# Patient Record
Sex: Male | Born: 1965 | Race: White | Hispanic: No | Marital: Married | Smoking: Current every day smoker
Health system: Southern US, Community
[De-identification: ages and names within clinical notes are randomized; demographics above are authoritative.]

## PROBLEM LIST (undated history)

## (undated) HISTORY — PX: APPENDECTOMY: SHX54

---

## 2013-10-26 ENCOUNTER — Ambulatory Visit: Payer: Self-pay | Admitting: Emergency Medicine

## 2019-06-19 ENCOUNTER — Emergency Department: Payer: Self-pay

## 2019-06-19 ENCOUNTER — Other Ambulatory Visit: Payer: Self-pay

## 2019-06-19 ENCOUNTER — Emergency Department
Admission: EM | Admit: 2019-06-19 | Discharge: 2019-06-20 | Disposition: A | Discharge status: Home / Self Care | Payer: Self-pay | Attending: Emergency Medicine | Admitting: Emergency Medicine

## 2019-06-19 ENCOUNTER — Encounter: Payer: Self-pay | Admitting: Emergency Medicine

## 2019-06-19 DIAGNOSIS — F17200 Nicotine dependence, unspecified, uncomplicated: Secondary | ICD-10-CM | POA: Insufficient documentation

## 2019-06-19 DIAGNOSIS — W11XXXA Fall on and from ladder, initial encounter: Secondary | ICD-10-CM | POA: Insufficient documentation

## 2019-06-19 DIAGNOSIS — S0990XA Unspecified injury of head, initial encounter: Secondary | ICD-10-CM

## 2019-06-19 DIAGNOSIS — M546 Pain in thoracic spine: Secondary | ICD-10-CM

## 2019-06-19 DIAGNOSIS — S22019A Unspecified fracture of first thoracic vertebra, initial encounter for closed fracture: Secondary | ICD-10-CM

## 2019-06-19 DIAGNOSIS — S098XXA Other specified injuries of head, initial encounter: Secondary | ICD-10-CM | POA: Insufficient documentation

## 2019-06-19 DIAGNOSIS — Y999 Unspecified external cause status: Secondary | ICD-10-CM | POA: Insufficient documentation

## 2019-06-19 DIAGNOSIS — Y9389 Activity, other specified: Secondary | ICD-10-CM | POA: Insufficient documentation

## 2019-06-19 DIAGNOSIS — S22069A Unspecified fracture of T7-T8 vertebra, initial encounter for closed fracture: Secondary | ICD-10-CM

## 2019-06-19 DIAGNOSIS — Y929 Unspecified place or not applicable: Secondary | ICD-10-CM | POA: Insufficient documentation

## 2019-06-19 LAB — CBC WITH DIFFERENTIAL/PLATELET
Abs Immature Granulocytes: 0.22 10*3/uL — ABNORMAL HIGH (ref 0.00–0.07)
Basophils Absolute: 0.1 10*3/uL (ref 0.0–0.1)
Basophils Relative: 1 %
Eosinophils Absolute: 0.3 10*3/uL (ref 0.0–0.5)
Eosinophils Relative: 2 %
HCT: 44.2 % (ref 39.0–52.0)
Hemoglobin: 14.9 g/dL (ref 13.0–17.0)
Immature Granulocytes: 2 %
Lymphocytes Relative: 21 %
Lymphs Abs: 2.9 10*3/uL (ref 0.7–4.0)
MCH: 33.6 pg (ref 26.0–34.0)
MCHC: 33.7 g/dL (ref 30.0–36.0)
MCV: 99.5 fL (ref 80.0–100.0)
Monocytes Absolute: 0.7 10*3/uL (ref 0.1–1.0)
Monocytes Relative: 5 %
Neutro Abs: 9.5 10*3/uL — ABNORMAL HIGH (ref 1.7–7.7)
Neutrophils Relative %: 69 %
Platelets: 277 10*3/uL (ref 150–400)
RBC: 4.44 MIL/uL (ref 4.22–5.81)
RDW: 11.9 % (ref 11.5–15.5)
WBC: 13.7 10*3/uL — ABNORMAL HIGH (ref 4.0–10.5)
nRBC: 0 % (ref 0.0–0.2)

## 2019-06-19 LAB — BASIC METABOLIC PANEL
Anion gap: 7 (ref 5–15)
BUN: 14 mg/dL (ref 6–20)
CO2: 25 mmol/L (ref 22–32)
Calcium: 9.4 mg/dL (ref 8.9–10.3)
Chloride: 107 mmol/L (ref 98–111)
Creatinine, Ser: 0.89 mg/dL (ref 0.61–1.24)
GFR calc Af Amer: >60 mL/min (ref >60)
GFR calc non Af Amer: >60 mL/min (ref >60)
Glucose, Bld: 100 mg/dL — ABNORMAL HIGH (ref 70–99)
Potassium: 3.7 mmol/L (ref 3.5–5.1)
Sodium: 139 mmol/L (ref 135–145)

## 2019-06-19 MED ORDER — MORPHINE SULFATE (PF) 4 MG/ML IV SOLN
INTRAVENOUS | Status: AC
  Filled 2019-06-19: qty 1

## 2019-06-19 MED ORDER — MORPHINE SULFATE (PF) 4 MG/ML IV SOLN
4.0000 mg | Freq: Once | INTRAVENOUS | Status: AC
  Administered 2019-06-19: 4 mg via INTRAVENOUS

## 2019-06-19 MED ORDER — MORPHINE SULFATE (PF) 4 MG/ML IV SOLN
4.0000 mg | Freq: Once | INTRAVENOUS | Status: AC
  Administered 2019-06-19: 4 mg via INTRAVENOUS
  Filled 2019-06-19: qty 1

## 2019-06-19 MED ORDER — IOHEXOL 300 MG/ML  SOLN
75.0000 mL | Freq: Once | INTRAMUSCULAR | Status: AC | PRN
  Administered 2019-06-19: 20:00:00 75 mL via INTRAVENOUS

## 2019-06-19 MED ORDER — ONDANSETRON HCL 4 MG/2ML IJ SOLN
4.0000 mg | Freq: Once | INTRAMUSCULAR | Status: AC
  Administered 2019-06-19: 4 mg via INTRAVENOUS
  Filled 2019-06-19: qty 2

## 2019-06-19 MED ORDER — OXYCODONE-ACETAMINOPHEN 5-325 MG PO TABS: 1.0000 | ORAL_TABLET | ORAL | 0 refills | Status: AC | PRN

## 2019-06-19 NOTE — ED Notes (Signed)
Patient transported to CT 

## 2019-06-19 NOTE — ED Triage Notes (Signed)
Pt was on top of building and fell to the ground. Landed on right shoulder. Has neck, shoulder pain, chest pain. Abrasion to head.

## 2019-06-19 NOTE — ED Notes (Signed)
Peripheral IV discontinued. Catheter intact. No signs of infiltration or redness. Gauze applied to IV site.   Discharge instructions reviewed with patient. Questions fielded by this RN. Patient verbalizes understanding of instructions. Patient discharged home in stable condition per siadecki. No acute distress noted at time of discharge.   

## 2019-06-19 NOTE — ED Notes (Addendum)
c-collar off att, pt requesting more pain meds, EDP notified

## 2019-06-19 NOTE — ED Notes (Signed)
Pt called son for pick up

## 2019-06-19 NOTE — ED Notes (Addendum)
Pt currently in c-collar. Pt c/o 10/10 chest pain, numbness tingling in right hand

## 2019-06-19 NOTE — Discharge Instructions (Signed)
We have provided numbers for follow-up both for a neurosurgery specialist (Dr. Lacinda Axon) as well as an orthopedist (Dr. Roland Rack).  You can follow-up with either one of them in 1 to 2 weeks to monitor the spinal fractures.  Return to the ER for new, worsening, or persistent severe pain, weakness or numbness, difficulty walking, shortness of breath, or any other new or worsening symptoms that concern you.

## 2019-06-19 NOTE — ED Provider Notes (Signed)
[facility] Emergency Department Provider Note ____________________________________________   First MD Initiated Contact with Patient 06/19/19 1815     (approximate)  I have reviewed the triage vital signs and the nursing notes.   HISTORY  Chief Complaint Fall (15 ft off building)    HPI Nathaniel Patterson is a 52 y.o. male with PMH as noted below who presents after a fall from approximately 15 feet.  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  He states that he hit his head but did not lose consciousness.  He primarily reports pain in his chest, shoulders, and neck.  History reviewed. No pertinent past medical history.  There are no active problems to display for this patient.   Past Surgical History:  Procedure Laterality Date  . APPENDECTOMY      Prior to Admission medications   Medication Sig Start Date End Date Taking? Authorizing Provider  oxyCODONE-acetaminophen (PERCOCET) 5-325 MG tablet Take 1 tablet by mouth every 4 (four) hours as needed for up to 7 days for severe pain. 06/19/19 06/26/19  Arta Silence, MD    Allergies Patient has no known allergies.  History reviewed. No pertinent family history.  Social History Social History   Tobacco Use  . Smoking status: Current Every Day Smoker    Packs/day: 0.50  . Smokeless tobacco: Never Used  Substance Use Topics  . Alcohol use: Not on file  . Drug use: Not on file    Review of Systems  Constitutional: No fever. Eyes: No visual changes. ENT: Positive for neck pain. Cardiovascular: Positive for chest pain. Respiratory: Denies shortness of breath. Gastrointestinal: No vomiting. Genitourinary: Negative for flank pain. Musculoskeletal: Positive for back pain. Skin: Negative for rash. Neurological: Negative for headache.   ____________________________________________   PHYSICAL EXAM:  VITAL SIGNS: ED Triage Vitals   Enc Vitals Group     BP 06/19/19 1810 122/80     Pulse Rate 06/19/19 1810 76     Resp 06/19/19 1810 18     Temp 06/19/19 1810 98.1 F (36.7 C)     Temp src --      SpO2 06/19/19 1810 98 %     Weight 06/19/19 1813 150 lb (68 kg)     Height 06/19/19 1813 5\' 7"  (1.702 m)     Head Circumference --      Peak Flow --      Pain Score 06/19/19 1813 10     Pain Loc --      Pain Edu? --      Excl. in [place]? --     Constitutional: Alert and oriented.  Uncomfortable appearing but in no acute distress. Eyes: Conjunctivae are normal.  Head: Atraumatic. Nose: No congestion/rhinnorhea. Mouth/Throat: Mucous membranes are moist.   Neck: Mild midline tenderness with no step-off or crepitus. Cardiovascular: Normal rate, regular rhythm. Grossly normal heart sounds.  Good peripheral circulation. Respiratory: Normal respiratory effort.  No retractions. Lungs CTAB. Gastrointestinal: Soft and nontender. No distention.  Genitourinary: No flank tenderness. Musculoskeletal: No lower extremity edema.  Extremities warm and well perfused.  Mild anterior sternal area tenderness.  Bilateral shoulders with full range of motion and no bony tenderness.  Mild thoracic spinal tenderness with no step-off or crepitus. Neurologic:  Normal speech and language.  5/5 motor strength and intact sensation to bilateral lower extremities.  No gross focal neurologic deficits are appreciated.  Skin:  Skin is warm and dry.  No rash noted. Psychiatric: Mood and affect are normal. Speech and behavior are normal.  ____________________________________________   LABS (all labs ordered are listed, but only abnormal results are displayed)  Labs Reviewed  BASIC METABOLIC PANEL - Abnormal; Notable for the following components:      Result Value   Glucose, Bld 100 (*)    All other components within normal limits  CBC WITH DIFFERENTIAL/PLATELET - Abnormal; Notable for the following components:   WBC 13.7 (*)    Neutro Abs 9.5 (*)     Abs Immature Granulocytes 0.22 (*)    All other components within normal limits   ____________________________________________  EKG  ED ECG REPORT I, Dionne BucySebastian Conway Fedora, the attending physician, personally viewed and interpreted this ECG.  Date: 06/19/2019 EKG Time: 1826 Rate: 76 Rhythm: normal sinus rhythm QRS Axis: Left axis Intervals: normal ST/T Wave abnormalities: normal Narrative Interpretation: no evidence of acute ischemia  ____________________________________________  RADIOLOGY  CT head: No ICH no acute fracture CT cervical spine: CT thoracic spine: T1 and T7 superior endplate compression fractures CT chest: No acute abnormality  ____________________________________________   PROCEDURES  Procedure(s) performed: No  Procedures  Critical Care performed: No ____________________________________________   INITIAL IMPRESSION / ASSESSMENT AND PLAN / ED COURSE  Pertinent labs & imaging results that were available during my care of the patient were reviewed by me and considered in my medical decision making (see chart for details).  53 year old male with PMH as noted above presents after a fall from approximately 15 feet when he was up on a ladder.  He states he hit his head but denies LOC.  He primarily reports anterior chest pain radiating to bilateral shoulders as well as his neck and upper back.  The patient's vital signs are normal.  He has no visible external injury.  Neurologic exam is nonfocal.  The remainder of the exam is as described above.  Based on exam will obtain CT head, cervical spine, thoracic spine, as well as a CT chest to rule out sternal fracture.  ----------------------------------------- 12:06 AM on 06/20/2019 -----------------------------------------  Lab work-up and CT imaging were negative except for T1 and T7 superior endplate compression fractures.  I consulted Dr. Joice LoftsPoggi from orthopedics who reviewed the images.  He advised that  these fractures do not require any specific intervention except for pain control and routine follow-up.  He offered to have the patient follow-up with orthopedics, but stated he could also follow-up with neurosurgery.  On reassessment, the patient appears more comfortable.  He feels well to go home.  I gave him referral information both for Dr. Joice LoftsPoggi as well as for neurosurgery.  Return precautions given, and he expressed understanding. ____________________________________________   FINAL CLINICAL IMPRESSION(S) / ED DIAGNOSES  Final diagnoses:  Thoracic back pain  Closed fracture of first thoracic vertebra, unspecified fracture morphology, initial encounter (HCC)  Closed fracture of seventh thoracic vertebra, unspecified fracture morphology, initial encounter (HCC)  Minor head injury, initial encounter      NEW MEDICATIONS STARTED DURING THIS VISIT:  Discharge Medication List as of 06/19/2019 11:19 PM    START taking these medications   Details  oxyCODONE-acetaminophen (PERCOCET) 5-325 MG tablet Take 1 tablet by mouth every 4 (four) hours as needed for up to 7 days for severe pain., Starting Sat 06/19/2019, Until Sat 06/26/2019, Normal         Note:  This document was prepared using Dragon voice recognition software and may include unintentional dictation errors.   Dionne BucySiadecki, Codi Kertz, MD  06/20/19 0008  

## 2020-04-04 IMAGING — CT CT HEAD WITHOUT CONTRAST
4 series · 16 of 47 positions shown, 18 images · non-contrast
Comparison: None.

CLINICAL DATA: 53-year-old male with head trauma.

EXAM:
CT HEAD WITHOUT CONTRAST
CT CERVICAL SPINE WITHOUT CONTRAST
TECHNIQUE: Multidetector CT imaging of the head and cervical spine was
performed following the standard protocol without intravenous
contrast. Multiplanar CT image reconstructions of the cervical spine
were also generated.

[Series 3: head bone · axial · 0.42mm/px · z∈[-52,-24]mm · 3 of 72 slices shown]
[im 8/72  bone]
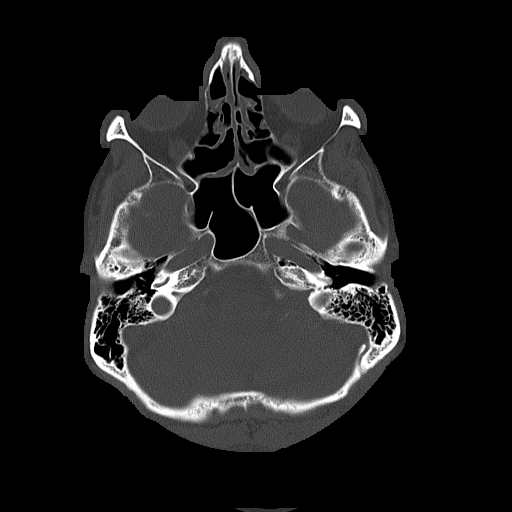
[im 15/72  bone]
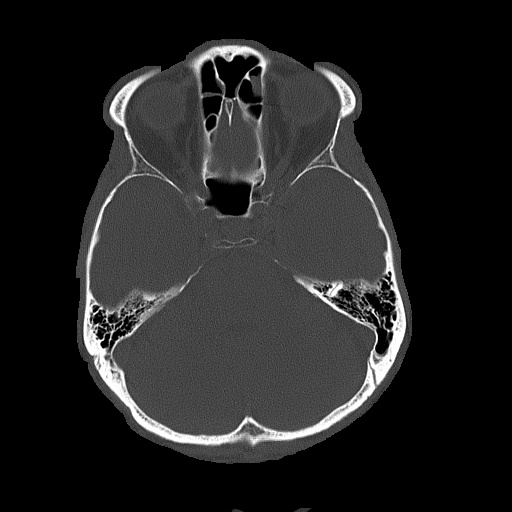
[im 22/72  bone]
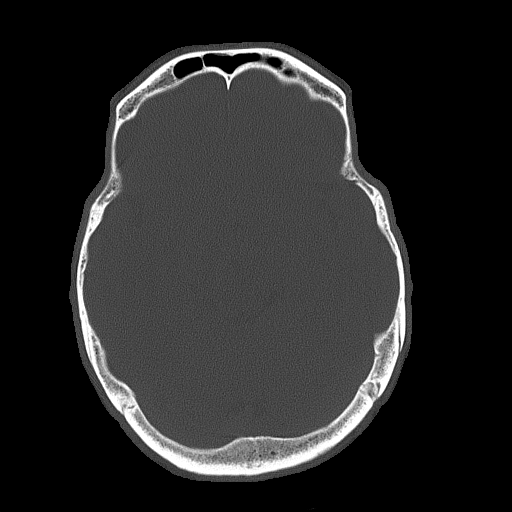

[Series 4: head wo · axial · 0.42mm/px · z∈[-51,+54]mm · 7 of 29 slices shown, 9 images]
[im 4/29  brain]
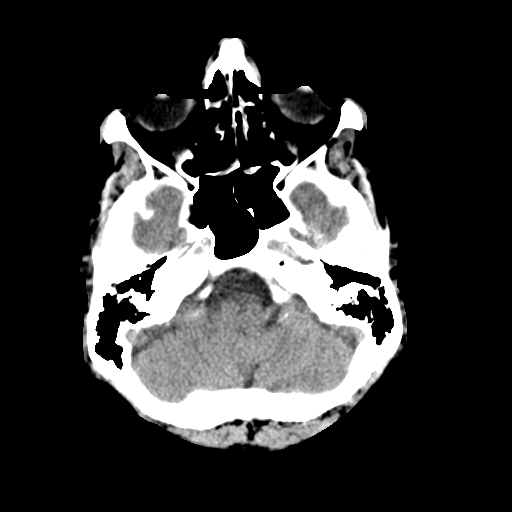
[im 4/29  bone]
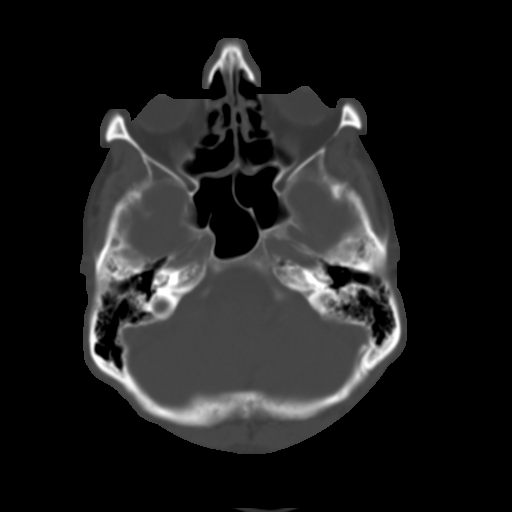
[im 8/29  brain]
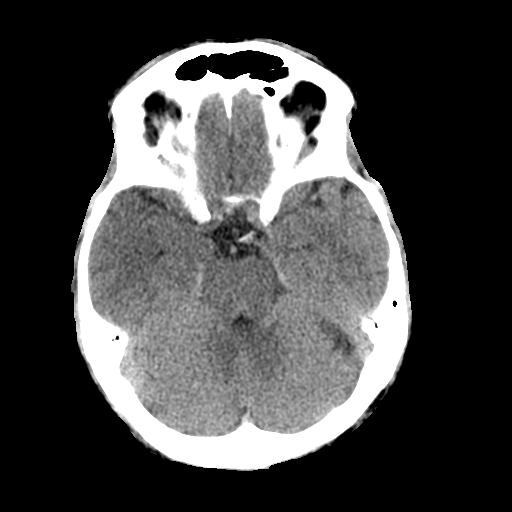
[im 11/29  brain]
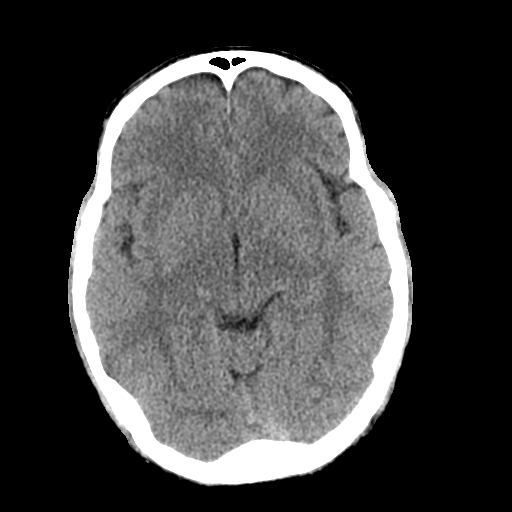
[im 15/29  brain]
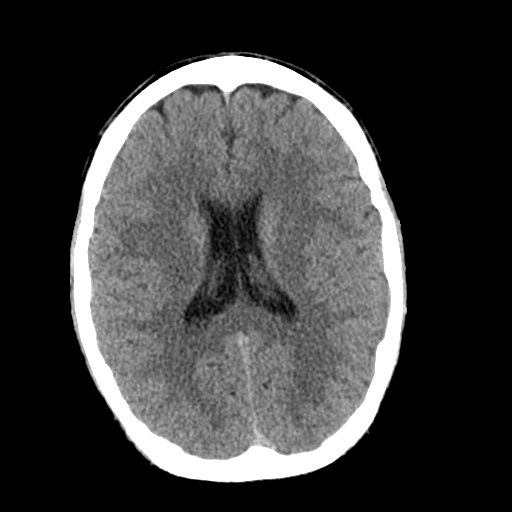
[im 18/29  brain]
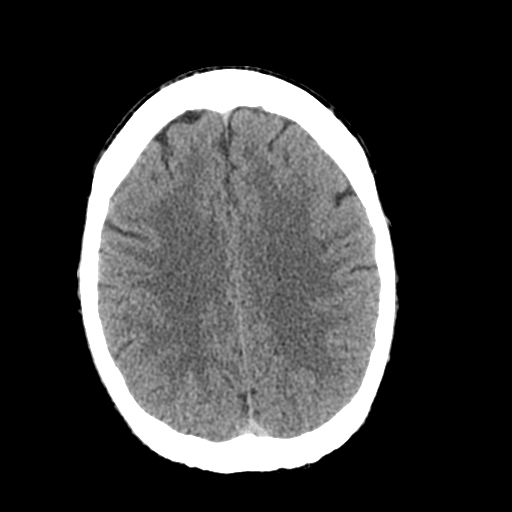
[im 18/29  bone]
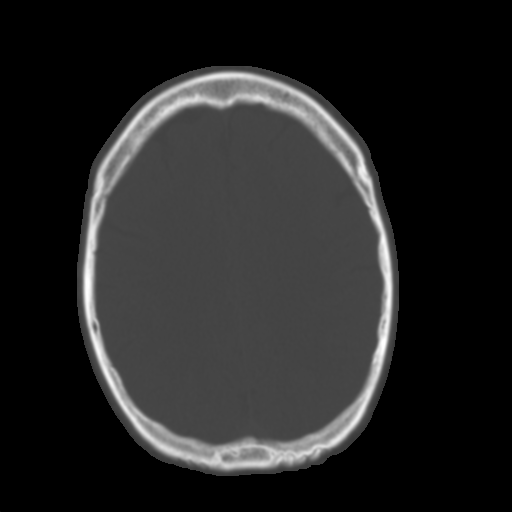
[im 22/29  brain]
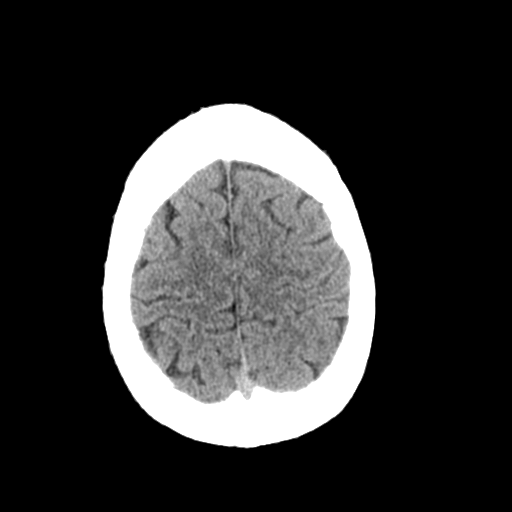
[im 25/29  brain]
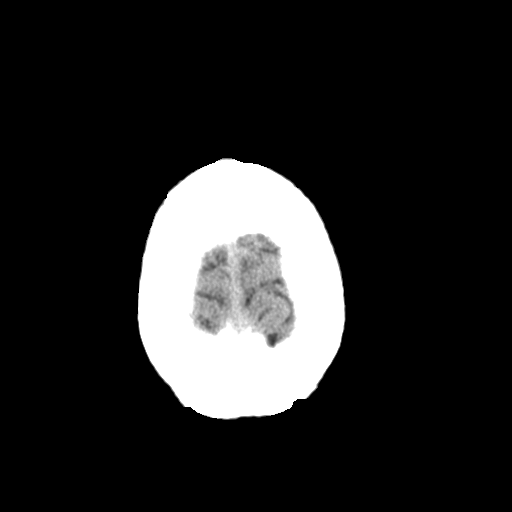

[Series 5: coronal soft tissue · coronal · 0.30mm/px · 3 of 62 slices shown]
[im 21/62  brain]
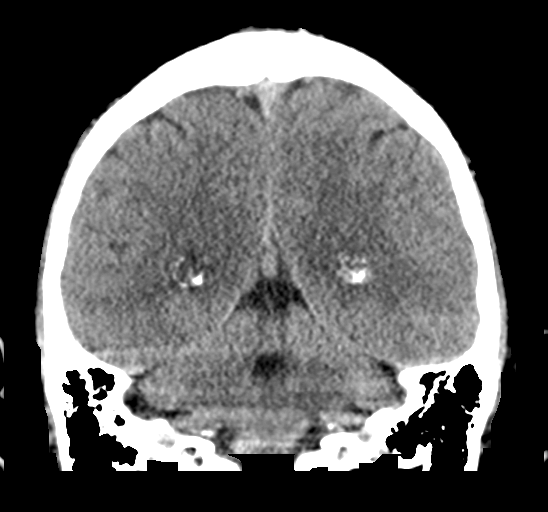
[im 28/62  brain]
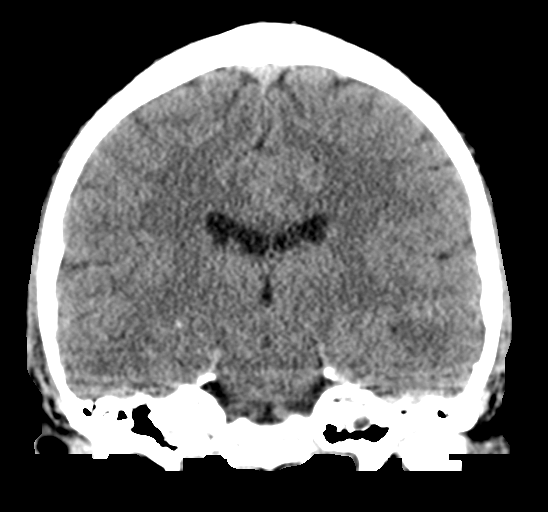
[im 34/62  brain]
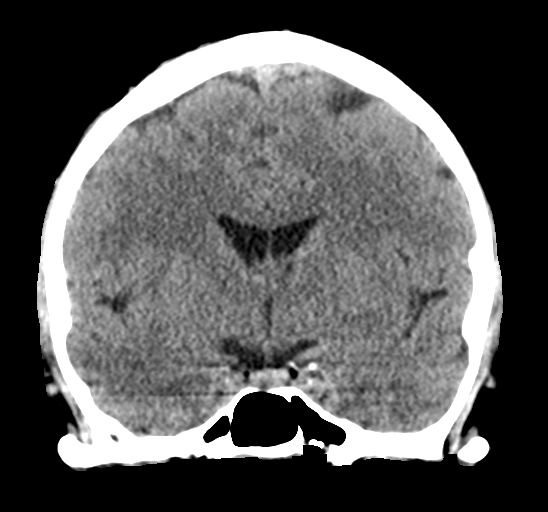

[Series 6: sagittal soft tissue · sagittal · 0.29mm/px · 3 of 49 slices shown]
[im 17/49  brain]
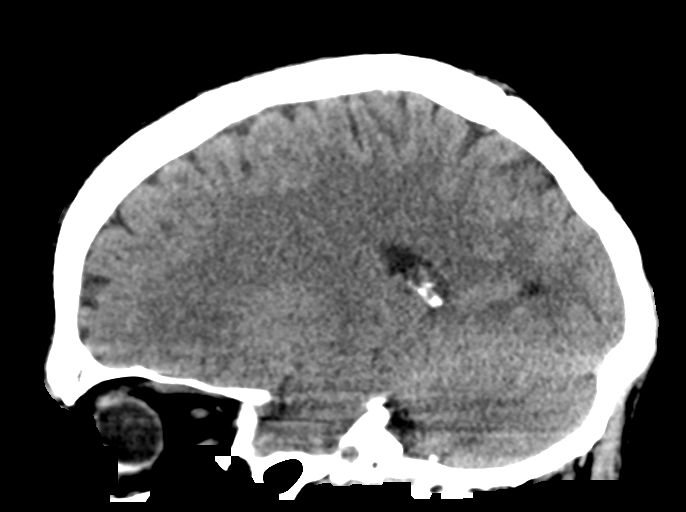
[im 25/49  brain]
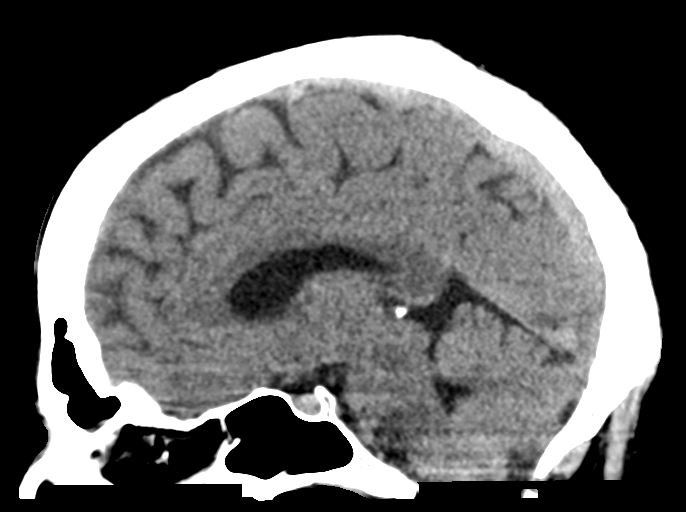
[im 33/49  brain]
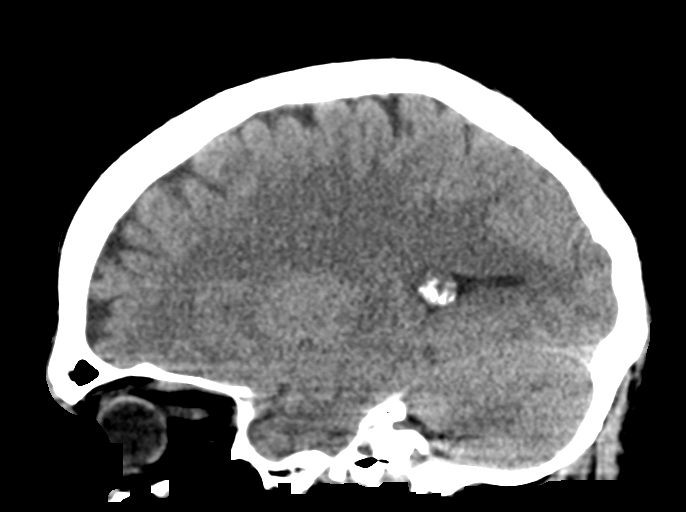

[16 of 47 positions shown; findings below may reference images not displayed]

FINDINGS: CT HEAD FINDINGS

Brain: The ventricles and sulci appropriate size for patient's age.
The gray-white matter discrimination is preserved. There is no acute
intracranial hemorrhage. No mass effect or midline shift. No
extra-axial fluid collection.

Vascular: No hyperdense vessel or unexpected calcification.

Skull: Normal. Negative for fracture or focal lesion.

Sinuses/Orbits: There is diffuse mucoperiosteal thickening of
paranasal sinuses. No air-fluid level. The mastoid air cells are
clear. Indentation of the right lamina papyracea, age indeterminate,
likely chronic. Clinical correlation is recommended.

Other: None

CT CERVICAL SPINE FINDINGS

Alignment: Normal.

Skull base and vertebrae: No acute fracture. No primary bone lesion
or focal pathologic process.

Soft tissues and spinal canal: No prevertebral fluid or swelling. No
visible canal hematoma.

Disc levels:  No acute findings. Mild degenerative changes.

Upper chest: Negative.

Other: None
IMPRESSION: 1. No acute intracranial pathology.
2. No acute/traumatic cervical spine pathology.
3. Paranasal sinus disease.

## 2022-10-19 ENCOUNTER — Encounter: Payer: Self-pay | Admitting: Emergency Medicine

## 2022-10-19 ENCOUNTER — Emergency Department
Admission: EM | Admit: 2022-10-19 | Discharge: 2022-10-19 | Disposition: A | Discharge status: Home / Self Care | Payer: Self-pay | Attending: Emergency Medicine | Admitting: Emergency Medicine

## 2022-10-19 ENCOUNTER — Other Ambulatory Visit: Payer: Self-pay

## 2022-10-19 ENCOUNTER — Emergency Department: Payer: Self-pay

## 2022-10-19 DIAGNOSIS — R079 Chest pain, unspecified: Secondary | ICD-10-CM

## 2022-10-19 DIAGNOSIS — R0789 Other chest pain: Secondary | ICD-10-CM | POA: Insufficient documentation

## 2022-10-19 DIAGNOSIS — E876 Hypokalemia: Secondary | ICD-10-CM | POA: Insufficient documentation

## 2022-10-19 LAB — CBC
HCT: 46.9 % (ref 39.0–52.0)
Hemoglobin: 15.8 g/dL (ref 13.0–17.0)
MCH: 33.8 pg (ref 26.0–34.0)
MCHC: 33.7 g/dL (ref 30.0–36.0)
MCV: 100.2 fL — ABNORMAL HIGH (ref 80.0–100.0)
Platelets: 269 10*3/uL (ref 150–400)
RBC: 4.68 MIL/uL (ref 4.22–5.81)
RDW: 11.8 % (ref 11.5–15.5)
WBC: 7.5 10*3/uL (ref 4.0–10.5)
nRBC: 0 % (ref 0.0–0.2)

## 2022-10-19 LAB — BASIC METABOLIC PANEL
Anion gap: 8 (ref 5–15)
BUN: 7 mg/dL (ref 6–20)
CO2: 27 mmol/L (ref 22–32)
Calcium: 9.2 mg/dL (ref 8.9–10.3)
Chloride: 103 mmol/L (ref 98–111)
Creatinine, Ser: 0.93 mg/dL (ref 0.61–1.24)
GFR, Estimated: >60 mL/min (ref >60)
Glucose, Bld: 124 mg/dL — ABNORMAL HIGH (ref 70–99)
Potassium: 3 mmol/L — ABNORMAL LOW (ref 3.5–5.1)
Sodium: 138 mmol/L (ref 135–145)

## 2022-10-19 LAB — TROPONIN I (HIGH SENSITIVITY): Troponin I (High Sensitivity): 4 ng/L (ref <18)

## 2022-10-19 MED ORDER — POTASSIUM CHLORIDE CRYS ER 20 MEQ PO TBCR
40.0000 meq | EXTENDED_RELEASE_TABLET | Freq: Once | ORAL | Status: AC
  Administered 2022-10-19: 40 meq via ORAL
  Filled 2022-10-19: qty 2

## 2022-10-19 NOTE — ED Triage Notes (Signed)
Pt to ED via POV stating that he has been having chest pain for 2-3 months. Pt states that he thought that it would go away but it hasn't. Pt states that he also been having periods of sweating a lot. Pt is currently in NAD.

## 2022-10-19 NOTE — ED Provider Notes (Signed)
[facility] Provider Note    Event Date/Time   First MD Initiated Contact with Patient 10/19/22 1226     (approximate)   History   Chest Pain   HPI  Nathaniel Patterson is a 56 y.o. male who presents with chest pain.  Patient notes that over the last 2 to 3 months he has had intermittent pain in the left shoulder and chest region.  Seems to be becoming more frequent.  He has difficulty describing it but feels like there is pain on the left lateral chest and up into the shoulder.  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  Pain is not clearly exertional and nonpleuritic.  He does feel somewhat winded when he is walking around this seems to be increasing.  Denies any history of cardiac disease, or known pmh. The patient denies hx of prior DVT/PE, unilateral leg pain/swelling, hormone use, recent surgery, hx of cancer, prolonged immobilization, or hemoptysis.  Yesterday patient got up out of bed and felt somewhat lightheaded dizzy he then became somewhat short of breath felt anxious and then diaphoretic.  Pain was not necessarily worse at that time.  He denies any significant pain now.    History reviewed. No pertinent past medical history.  There are no problems to display for this patient.    Physical Exam  Triage Vital Signs: ED Triage Vitals  Enc Vitals Group     BP 10/19/22 1137 (!) 133/103     Pulse Rate 10/19/22 1137 (!) 102     Resp 10/19/22 1137 16     Temp 10/19/22 1137 98.6 F (37 C)     Temp Source 10/19/22 1137 Oral     SpO2 10/19/22 1137 97 %     Weight 10/19/22 1255 149 lb 14.6 oz (68 kg)     Height 10/19/22 1255 5\' 7"  (1.702 m)     Head Circumference --      Peak Flow --      Pain Score 10/19/22 1138 3     Pain Loc --      Pain Edu? --      Excl. in [place]? --     Most recent vital signs: Vitals:   10/19/22 1137  BP: (!) 133/103  Pulse: (!) 102  Resp: 16  Temp: 98.6 F (37 C)  SpO2: 97%     General: Awake, no  distress.  CV:  Good peripheral perfusion.  No peripheral edema Resp:  Normal effort.  Lungs are clear Abd:  No distention.  Neuro:             Awake, Alert, Oriented x 3  Other:  Mild tenderness with palpation of the biceps tendon and anterior shoulder but patient is able to range the shoulder, mild anterior chest wall tenderness   ED Results / Procedures / Treatments  Labs (all labs ordered are listed, but only abnormal results are displayed) Labs Reviewed  BASIC METABOLIC PANEL - Abnormal; Notable for the following components:      Result Value   Potassium 3.0 (*)    Glucose, Bld 124 (*)    All other components within normal limits  CBC - Abnormal; Notable for the following components:   MCV 100.2 (*)    All other components within normal limits  TROPONIN I (HIGH SENSITIVITY)     EKG  EKG shows sinus tachycardia with a left anterior fascicular block subtle ST depression throughout no acute ischemic changes  RADIOLOGY I reviewed and interpreted the CXR which does not show any acute cardiopulmonary process    PROCEDURES:  Critical Care performed: No  Procedures   MEDICATIONS ORDERED IN ED: Medications  potassium chloride SA (KLOR-CON M) CR tablet 40 mEq (40 mEq Oral Given 10/19/22 1323)     IMPRESSION / MDM / ASSESSMENT AND PLAN / ED COURSE  I reviewed the triage vital signs and the nursing notes.                              Patient's presentation is most consistent with acute presentation with potential threat to life or bodily function.  Differential diagnosis includes, but is not limited to, ACS, PE, musculoskeletal pain, referred pain from biceps tendinitis or glenohumeral pathology, chest wall strain, pericarditis/myocarditis  The patient is a 56 year old male presents with 2 to 3 months of left chest wall and shoulder pain.  It is worsening over the last several weeks.  Patient has difficulty describing any clear exacerbating or alleviating factors or  the nature of the pain but he points to the left chest and left shoulder.  It is not clearly exertional and nonpleuritic.  Yesterday when getting out of bed did feel somewhat lightheaded and became diaphoretic but the pain was not necessarily worse at this time.  He does work in Architect.  Vital signs are notable for low-grade tachycardia and elevated diastolic blood pressure but the rest of his vitals are reassuring.  Overall he looks well there is no signs of DVT he is mildly tender over the biceps tendon and anterior shoulder but he can range his shoulder fine.  His lungs are clear.  Patient's EKG does have some diffuse ST depression but he is hypokalemic which I suspect could be the cause.  His troponin is negative.  Ultimately his story does not clearly suggest unstable angina or ACS to me.  Consider pulmonary embolism given the dyspnea but this does not fit as much with the timeframe of 2 to 3 months and intermittent symptoms.  Ultimately I think this could be musculoskeletal in nature but I would like him to follow-up with cardiology.  Would also like him to follow-up with primary care for further evaluation and reassessment of his potassium.  He was given supplemental p.o. potassium in the ED.  Patient understanding of the plan.       FINAL CLINICAL IMPRESSION(S) / ED DIAGNOSES   Final diagnoses:  Chest pain, unspecified type  Hypokalemia     Rx / DC Orders   ED Discharge Orders          Ordered    Ambulatory referral to Cardiology       Comments: If you have not heard from the Cardiology office within the next 72 hours please call [phone].   10/19/22 1337             Note:  This document was prepared using Dragon voice recognition software and may include unintentional dictation errors.   Rada Hay, MD 10/19/22 [phone]

## 2022-10-19 NOTE — ED Notes (Signed)
See triage note  Presents with a 2-3 month hx of chest pain   Denies any trauma or cough  Unsure of fever but has had some episodes of sweating

## 2022-10-19 NOTE — Discharge Instructions (Signed)
Your cardiac work-up was reassuring however I would like you to follow-up with cardiology as you may need to have a stress test to further evaluate your chest pain.  If your pain is worsening or lasting longer than it typically has or you develop new symptoms that are concerning to you please return to the emergency department.  Please make sure you are eating a nutrient rich diet, your potassium was low and this could be due to poor dietary intake.  Please follow-up with a primary care doctor to have repeat potassium level checked in about 1 week.
